# Patient Record
Sex: Male | Born: 1975 | Race: White | Hispanic: No | Marital: Married | Smoking: Never smoker
Health system: Southern US, Community
[De-identification: ages and names within clinical notes are randomized; demographics above are authoritative.]

---

## 2019-12-30 ENCOUNTER — Other Ambulatory Visit: Payer: Self-pay

## 2019-12-30 ENCOUNTER — Encounter: Payer: Self-pay | Admitting: Emergency Medicine

## 2019-12-30 ENCOUNTER — Emergency Department
Admission: EM | Admit: 2019-12-30 | Discharge: 2019-12-30 | Disposition: A | Discharge status: Home / Self Care | Payer: BC Managed Care – PPO | Attending: Emergency Medicine | Admitting: Emergency Medicine

## 2019-12-30 ENCOUNTER — Emergency Department: Payer: BC Managed Care – PPO

## 2019-12-30 DIAGNOSIS — R202 Paresthesia of skin: Secondary | ICD-10-CM

## 2019-12-30 DIAGNOSIS — G5621 Lesion of ulnar nerve, right upper limb: Secondary | ICD-10-CM | POA: Diagnosis not present

## 2019-12-30 DIAGNOSIS — R200 Anesthesia of skin: Secondary | ICD-10-CM | POA: Diagnosis present

## 2019-12-30 LAB — CBC WITH DIFFERENTIAL/PLATELET
Abs Immature Granulocytes: 0.01 10*3/uL (ref 0.00–0.07)
Basophils Absolute: 0.1 10*3/uL (ref 0.0–0.1)
Basophils Relative: 1 %
Eosinophils Absolute: 0.3 10*3/uL (ref 0.0–0.5)
Eosinophils Relative: 5 %
HCT: 48.2 % (ref 39.0–52.0)
Hemoglobin: 16.5 g/dL (ref 13.0–17.0)
Immature Granulocytes: 0 %
Lymphocytes Relative: 45 %
Lymphs Abs: 2.4 10*3/uL (ref 0.7–4.0)
MCH: 31 pg (ref 26.0–34.0)
MCHC: 34.2 g/dL (ref 30.0–36.0)
MCV: 90.4 fL (ref 80.0–100.0)
Monocytes Absolute: 0.4 10*3/uL (ref 0.1–1.0)
Monocytes Relative: 7 %
Neutro Abs: 2.3 10*3/uL (ref 1.7–7.7)
Neutrophils Relative %: 42 %
Platelets: 180 10*3/uL (ref 150–400)
RBC: 5.33 MIL/uL (ref 4.22–5.81)
RDW: 12.5 % (ref 11.5–15.5)
WBC: 5.4 10*3/uL (ref 4.0–10.5)
nRBC: 0 % (ref 0.0–0.2)

## 2019-12-30 LAB — BASIC METABOLIC PANEL
Anion gap: 6 (ref 5–15)
BUN: 14 mg/dL (ref 6–20)
CO2: 28 mmol/L (ref 22–32)
Calcium: 9.1 mg/dL (ref 8.9–10.3)
Chloride: 108 mmol/L (ref 98–111)
Creatinine, Ser: 0.94 mg/dL (ref 0.61–1.24)
GFR calc Af Amer: >60 mL/min (ref >60)
GFR calc non Af Amer: >60 mL/min (ref >60)
Glucose, Bld: 106 mg/dL — ABNORMAL HIGH (ref 70–99)
Potassium: 4.2 mmol/L (ref 3.5–5.1)
Sodium: 142 mmol/L (ref 135–145)

## 2019-12-30 LAB — TROPONIN I (HIGH SENSITIVITY): Troponin I (High Sensitivity): <2 ng/L (ref <18)

## 2019-12-30 NOTE — ED Provider Notes (Signed)
[facility] Emergency Department Provider Note  Time seen: 7:58 AM  I have reviewed the triage vital signs and the nursing notes.   HISTORY  Chief Complaint Numbness   HPI Jared Kennedy is a 44 y.o. male with no significant past medical history presents to the emergency department for right upper extremity numbness/tingling.  According to the patient he woke this morning with a numbness sensation in his forearm extending down into his little finger and ring finger.  Patient states he has not had that sensation previously.  States he then lied in bed for 5 to 10 minutes, at that time he does admit that he was very concerned about what could be happening and states he might of "panicked."  Patient states he then felt tingling in both of his lower extremities and felt like "the blood was leaving his extremities" and he began feeling clammy and unwell.  States he woke his wife and told her they should go to the emergency department for something did not feel right.  Patient states upon arrival to the emergency department his symptoms had resolved.  Denies any right upper extremity symptoms at this time.   History reviewed. No pertinent past medical history.  There are no problems to display for this patient.   History reviewed. No pertinent surgical history.  Prior to Admission medications   Not on File    No Known Allergies  No family history on file.  Social History Social History   Tobacco Use  . Smoking status: Never Smoker  . Smokeless tobacco: Never Used  Substance Use Topics  . Alcohol use: Yes  . Drug use: Never    Review of Systems Constitutional: Negative for fever. Cardiovascular: Negative for chest pain. Respiratory: Negative for shortness of breath. Gastrointestinal: Negative for abdominal pain Genitourinary: Negative for urinary compaints Musculoskeletal: Negative for musculoskeletal complaints Skin: Negative for skin complaints   Neurological: Negative for headache.  Numbness in the right upper extremity has since resolved. All other ROS negative  ____________________________________________   PHYSICAL EXAM:  VITAL SIGNS: ED Triage Vitals  Enc Vitals Group     BP 12/30/19 0446 131/79     Pulse Rate 12/30/19 0446 68     Resp 12/30/19 0446 20     Temp 12/30/19 0446 97.8 F (36.6 C)     Temp Source 12/30/19 0446 Oral     SpO2 12/30/19 0446 100 %     Weight 12/30/19 0446 225 lb (102.1 kg)     Height 12/30/19 0446 6\' 2"  (1.88 m)     Head Circumference --      Peak Flow --      Pain Score 12/30/19 0452 0     Pain Loc --      Pain Edu? --      Excl. in GC? --    Constitutional: Alert and oriented. Well appearing and in no distress. Eyes: Normal exam ENT      Head: Normocephalic and atraumatic.      Mouth/Throat: Mucous membranes are moist. Cardiovascular: Normal rate, regular rhythm.  Respiratory: Normal respiratory effort without tachypnea nor retractions. Breath sounds are clear Gastrointestinal: Soft and nontender. No distention.  Musculoskeletal: Nontender with normal range of motion in all extremities.  Neurologic:  Normal speech and language. No gross focal neurologic deficits.  Equal grip strengths.  No pronator drift. Skin:  Skin is warm, dry and intact.  Psychiatric: Mood and affect are normal.   ____________________________________________    EKG  EKG viewed and interpreted by myself shows a normal sinus rhythm at 70 bpm with a narrow QRS, normal axis, normal intervals, no concerning ST changes.  ____________________________________________    RADIOLOGY  CT head negative  ____________________________________________   INITIAL IMPRESSION / ASSESSMENT AND PLAN / ED COURSE  Pertinent labs & imaging results that were available during my care of the patient were reviewed by me and considered in my medical decision making (see chart for details).   Patient presents to the emergency  department for numbness in his right upper extremity followed by an unwell feeling in which he states he felt lightheaded, felt like the blood left his extremities, and felt a tingling sensation mostly in his lower extremities.  Patient's initial symptoms seem very suggestive of a ulnar neuropathy or neuropraxia.  Patient states he just awoke and when the symptoms started is not sure if he slept on the arm wrong.  States his symptoms started at the elbow and he felt a numbness or tingling sensation down into his ring finger and little finger.  Patient then admits that he "panicked" and was very worried about what this could be.  Began feeling unwell per patient with tingling over his body and lightheadedness.  Reassuringly patient's work-up is largely normal including a negative troponin and reassuring EKG.  Physical exam is reassuring including a normal neurologic exam.  Given the patient's symptoms however we will obtain a CT scan of the head as a precaution.  Patient states he has had somewhat similar tingling sensations previously as well as near syncopal episodes.  However was told these episodes are likely vagal in nature.  CT scan of the head is negative.  Patient continues to appear well.  We will discharge home.  Jared Kennedy was evaluated in Emergency Department on 12/30/2019 for the symptoms described in the history of present illness. He was evaluated in the context of the global COVID-19 pandemic, which necessitated consideration that the patient might be at risk for infection with the SARS-CoV-2 virus that causes COVID-19. Institutional protocols and algorithms that pertain to the evaluation of patients at risk for COVID-19 are in a state of rapid change based on information released by regulatory bodies including the CDC and federal and state organizations. These policies and algorithms were followed during the patient's care in the ED.  ____________________________________________   FINAL  CLINICAL IMPRESSION(S) / ED DIAGNOSES  Ulnar neuropathy Lightheadedness   Jared Dark, MD 12/30/19 [phone]

## 2019-12-30 NOTE — ED Notes (Signed)
Per EDP no need for second troponin draw at this time.

## 2019-12-30 NOTE — ED Triage Notes (Signed)
Pt arrives via [facility] with c/o numbness around 30 minutes prior to calling EMS. Pt states that numbness disappeared shortly after and denies any at this time. Pt is able to move all extremeties freely and has no deficits at this time. Orders given from MD.

## 2021-11-13 IMAGING — CT CT HEAD W/O CM
3 series · 16 of 47 positions shown, 19 images · non-contrast
Comparison: None.

CLINICAL DATA: Right upper extremity numbness, now resolved

EXAM:
CT HEAD WITHOUT CONTRAST
TECHNIQUE: Contiguous axial images were obtained from the base of the skull
through the vertex without intravenous contrast.

[Series 2: head wo · axial · 0.42mm/px · z∈[-108,+17]mm · 10 of 30 slices shown, 13 images]
[im 3/30  brain]
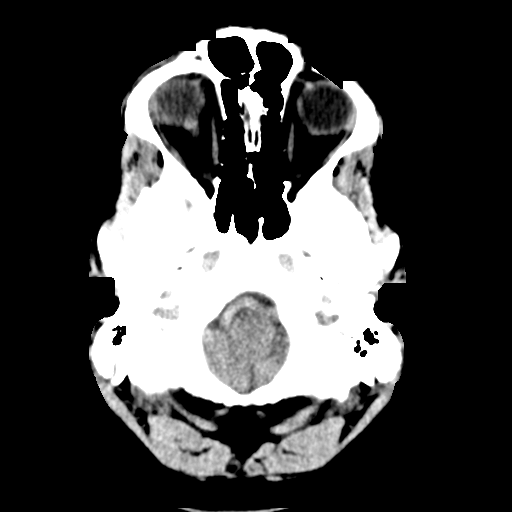
[im 3/30  bone]
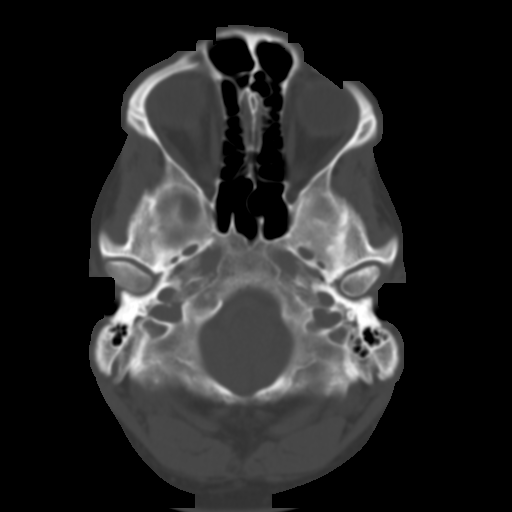
[im 6/30  brain]
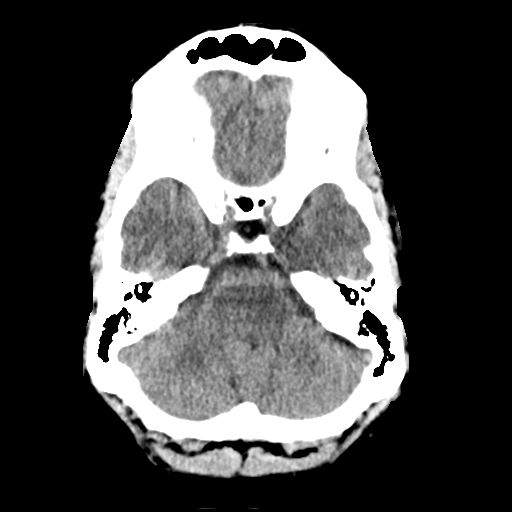
[im 9/30  brain]
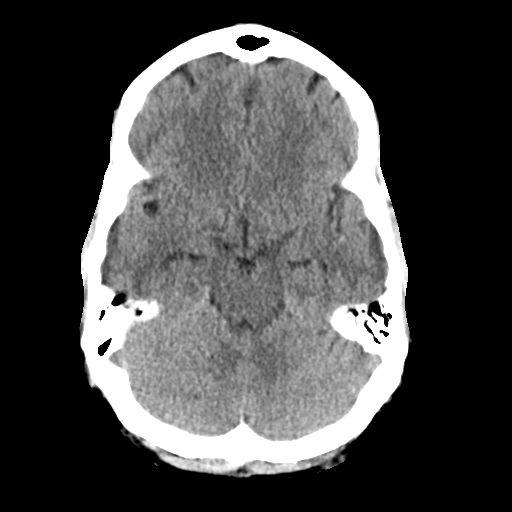
[im 11/30  brain]
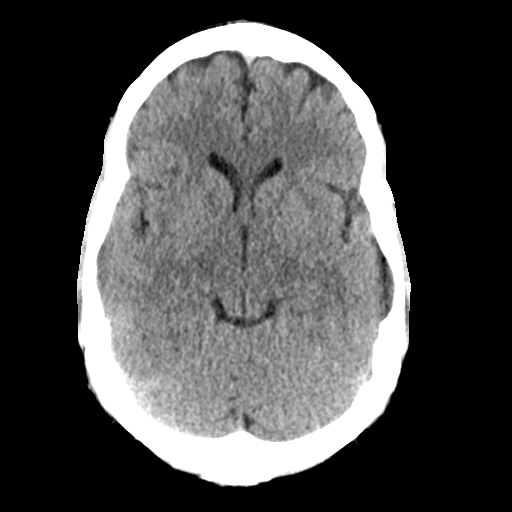
[im 14/30  brain]
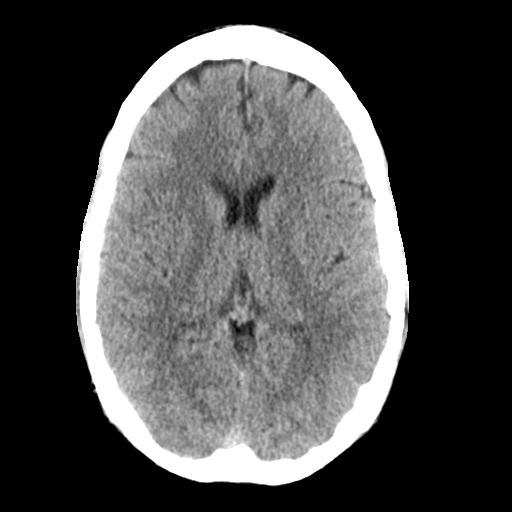
[im 14/30  bone]
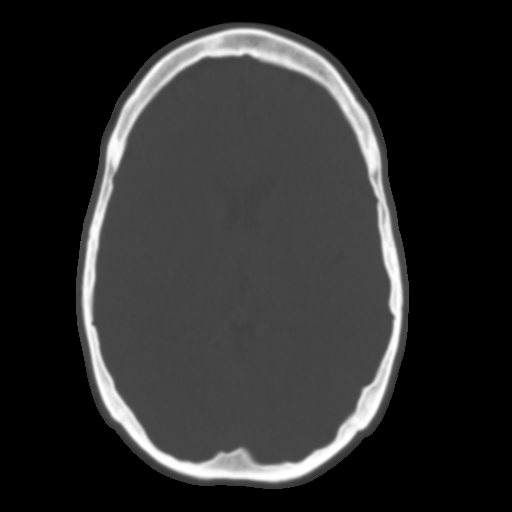
[im 17/30  brain]
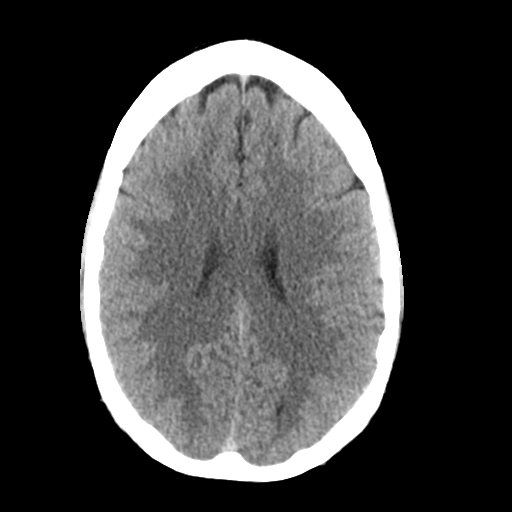
[im 20/30  brain]
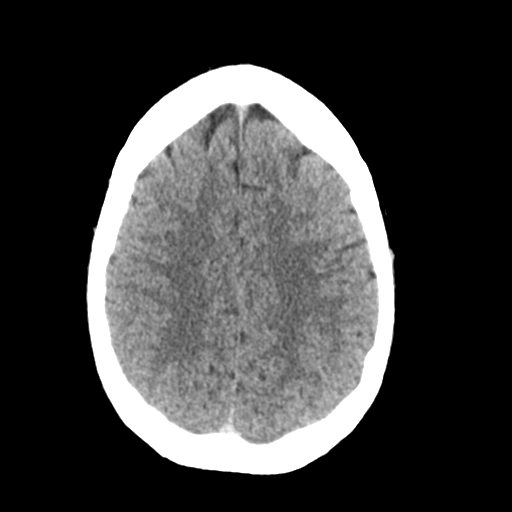
[im 23/30  brain]
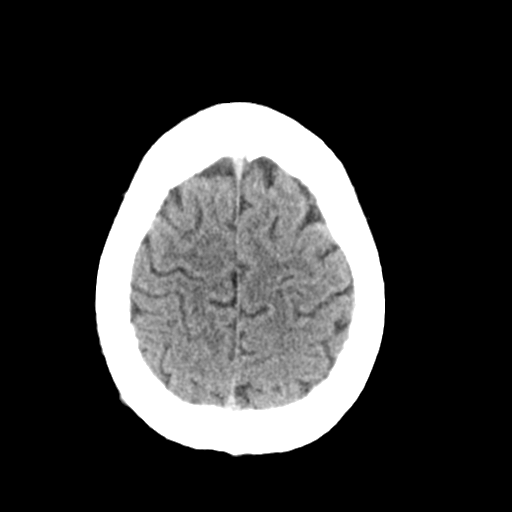
[im 25/30  brain]
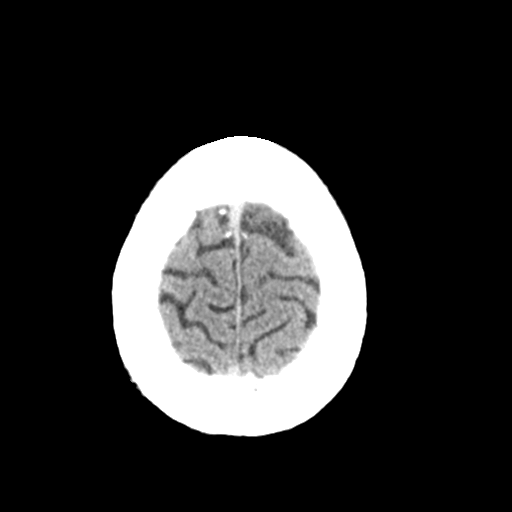
[im 25/30  bone]
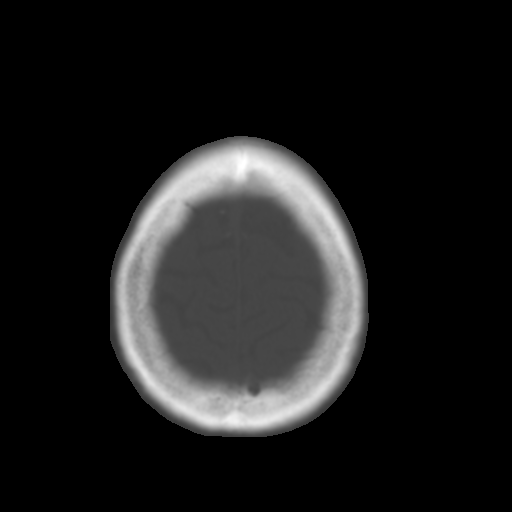
[im 28/30  brain]
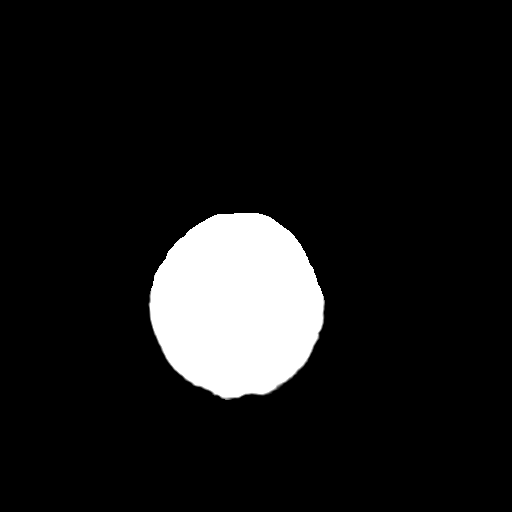

[Series 4: coronal soft tissue · coronal · 0.31mm/px · 3 of 70 slices shown]
[im 24/70  brain]
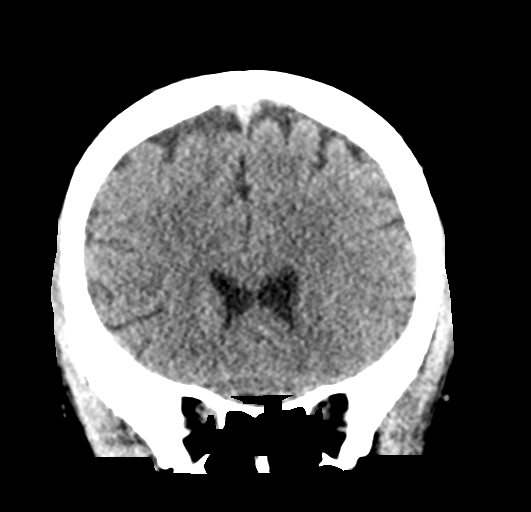
[im 31/70  brain]
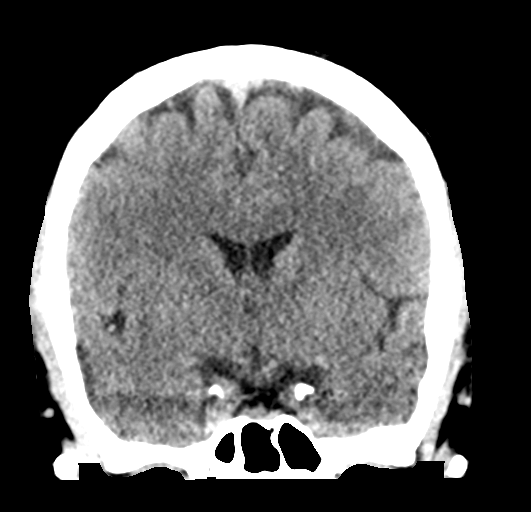
[im 39/70  brain]
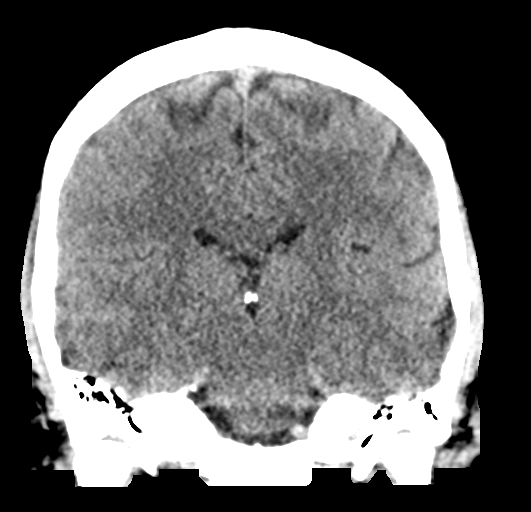

[Series 5: sagittal soft tissue · sagittal · 0.32mm/px · 3 of 52 slices shown]
[im 18/52  brain]
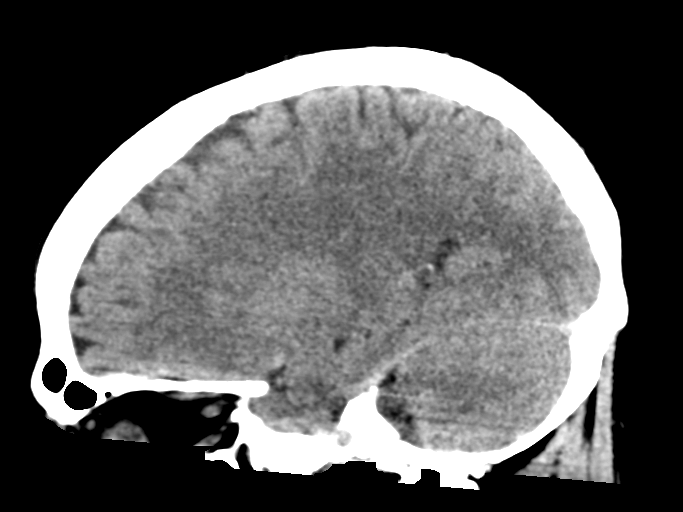
[im 26/52  brain]
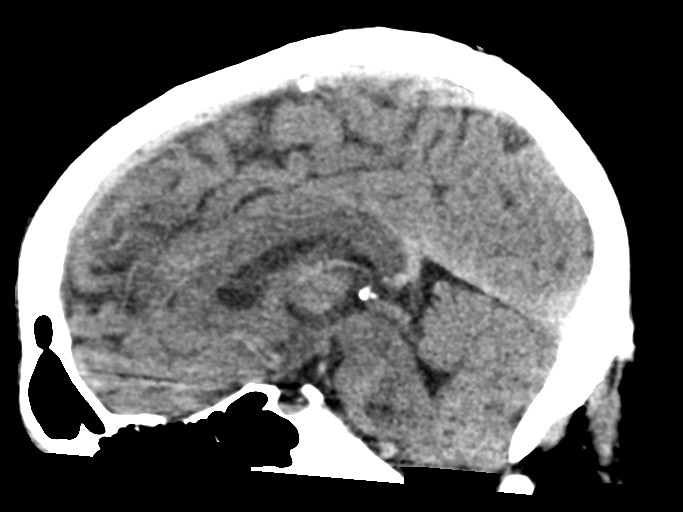
[im 35/52  brain]
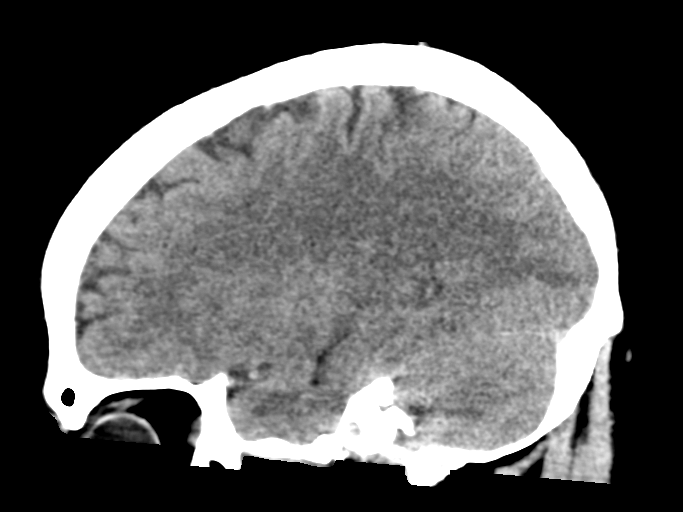

[16 of 47 positions shown; findings below may reference images not displayed]

FINDINGS: Brain: There is no acute intracranial hemorrhage, mass effect, or
edema. Gray-white differentiation is preserved. There is no
extra-axial fluid collection. Ventricles and sulci are within normal
limits in size and configuration.

Vascular: No hyperdense vessel or unexpected calcification.

Skull: Calvarium is unremarkable.

Sinuses/Orbits: No acute finding.

Other: None.
IMPRESSION: No acute intracranial hemorrhage, mass effect, or evidence of acute
infarction.

## 2022-01-04 ENCOUNTER — Other Ambulatory Visit: Payer: Self-pay | Admitting: Family Medicine

## 2022-01-04 DIAGNOSIS — N50811 Right testicular pain: Secondary | ICD-10-CM

## 2022-01-18 ENCOUNTER — Ambulatory Visit
Admission: RE | Admit: 2022-01-18 | Discharge: 2022-01-18 | Disposition: A | Discharge status: Home / Self Care | Payer: BC Managed Care – PPO | Source: Ambulatory Visit | Attending: Family Medicine | Admitting: Family Medicine

## 2022-01-18 DIAGNOSIS — N50811 Right testicular pain: Secondary | ICD-10-CM
